# Patient Record
Sex: Male | Born: 1995 | Race: White | Hispanic: No | Marital: Single | Smoking: Never smoker
Health system: Southern US, Community
[De-identification: ages and names within clinical notes are randomized; demographics above are authoritative.]

---
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
## 2015-06-06 ENCOUNTER — Emergency Department (HOSPITAL_COMMUNITY): Payer: BLUE CROSS/BLUE SHIELD

## 2015-06-06 ENCOUNTER — Emergency Department (HOSPITAL_COMMUNITY)
Admission: EM | Admit: 2015-06-06 | Discharge: 2015-06-06 | Disposition: A | Discharge status: Home / Self Care | Payer: BLUE CROSS/BLUE SHIELD | Attending: Emergency Medicine | Admitting: Emergency Medicine

## 2015-06-06 ENCOUNTER — Encounter (HOSPITAL_COMMUNITY): Payer: Self-pay | Admitting: Emergency Medicine

## 2015-06-06 DIAGNOSIS — Y9351 Activity, roller skating (inline) and skateboarding: Secondary | ICD-10-CM | POA: Diagnosis not present

## 2015-06-06 DIAGNOSIS — Y999 Unspecified external cause status: Secondary | ICD-10-CM | POA: Diagnosis not present

## 2015-06-06 DIAGNOSIS — S93401A Sprain of unspecified ligament of right ankle, initial encounter: Secondary | ICD-10-CM | POA: Diagnosis not present

## 2015-06-06 DIAGNOSIS — S99911A Unspecified injury of right ankle, initial encounter: Secondary | ICD-10-CM | POA: Diagnosis present

## 2015-06-06 DIAGNOSIS — Y9289 Other specified places as the place of occurrence of the external cause: Secondary | ICD-10-CM | POA: Diagnosis not present

## 2015-06-06 MED ORDER — NAPROXEN 500 MG PO TABS: 500.0000 mg | ORAL_TABLET | Freq: Two times a day (BID) | ORAL | Status: AC

## 2015-06-06 NOTE — ED Notes (Signed)
Previously injured right ankle playing basketball. Yesterday was at the beach yesterday, was skating, fell and twisted right ankle. Ankle moderately swollen. Pt states he's been using ice and ibuprofen since fall.

## 2015-06-06 NOTE — Discharge Instructions (Signed)
Naprosyn for pain and inflammation. Ice, elevate. Crutches for 1-2 days as needed. Follow up with primary care doctor or orthopedics as needed.    Ankle Sprain An ankle sprain is an injury to the strong, fibrous tissues (ligaments) that hold the bones of your ankle joint together.  CAUSES An ankle sprain is usually caused by a fall or by twisting your ankle. Ankle sprains most commonly occur when you step on the outer edge of your foot, and your ankle turns inward. People who participate in sports are more prone to these types of injuries.  SYMPTOMS   Pain in your ankle. The pain may be present at rest or only when you are trying to stand or walk.  Swelling.  Bruising. Bruising may develop immediately or within 1 to 2 days after your injury.  Difficulty standing or walking, particularly when turning corners or changing directions. DIAGNOSIS  Your caregiver will ask you details about your injury and perform a physical exam of your ankle to determine if you have an ankle sprain. During the physical exam, your caregiver will press on and apply pressure to specific areas of your foot and ankle. Your caregiver will try to move your ankle in certain ways. An X-ray exam may be done to be sure a bone was not broken or a ligament did not separate from one of the bones in your ankle (avulsion fracture).  TREATMENT  Certain types of braces can help stabilize your ankle. Your caregiver can make a recommendation for this. Your caregiver may recommend the use of medicine for pain. If your sprain is severe, your caregiver may refer you to a surgeon who helps to restore function to parts of your skeletal system (orthopedist) or a physical therapist. HOME CARE INSTRUCTIONS   Apply ice to your injury for 1-2 days or as directed by your caregiver. Applying ice helps to reduce inflammation and pain.  Put ice in a plastic bag.  Place a towel between your skin and the bag.  Leave the ice on for 15-20 minutes  at a time, every 2 hours while you are awake.  Only take over-the-counter or prescription medicines for pain, discomfort, or fever as directed by your caregiver.  Elevate your injured ankle above the level of your heart as much as possible for 2-3 days.  If your caregiver recommends crutches, use them as instructed. Gradually put weight on the affected ankle. Continue to use crutches or a cane until you can walk without feeling pain in your ankle.  If you have a plaster splint, wear the splint as directed by your caregiver. Do not rest it on anything harder than a pillow for the first 24 hours. Do not put weight on it. Do not get it wet. You may take it off to take a shower or bath.  You may have been given an elastic bandage to wear around your ankle to provide support. If the elastic bandage is too tight (you have numbness or tingling in your foot or your foot becomes cold and blue), adjust the bandage to make it comfortable.  If you have an air splint, you may blow more air into it or let air out to make it more comfortable. You may take your splint off at night and before taking a shower or bath. Wiggle your toes in the splint several times per day to decrease swelling. SEEK MEDICAL CARE IF:   You have rapidly increasing bruising or swelling.  Your toes feel extremely cold or  you lose feeling in your foot.  Your pain is not relieved with medicine. SEEK IMMEDIATE MEDICAL CARE IF:  Your toes are numb or blue.  You have severe pain that is increasing. MAKE SURE YOU:   Understand these instructions.  Will watch your condition.  Will get help right away if you are not doing well or get worse. Document Released: 10/24/2005 Document Revised: 07/18/2012 Document Reviewed: 11/05/2011 [facility] Patient Information 2015 [place], [place]. This information is not intended to replace advice given to you by your health care provider. Make sure you discuss any questions you have with your health  care provider.

## 2015-06-06 NOTE — ED Provider Notes (Signed)
CSN: [id number]     Arrival date & time 06/06/15  1453 History  This chart was scribed for non-physician practitioner Jaynie Crumble, PA-C, working with Gwyneth Sprout, MD, by Tanda Rockers, ED Scribe. This patient was seen in room WTR5/WTR5 and the patient's care was started at 3:30 PM.   Chief Complaint  Patient presents with  . Ankle Injury   The history is provided by the patient. No language interpreter was used.     HPI Comments: Thomas Singh is a 19 y.o. male who presents to the Emergency Department complaining of sudden onset, mild, right ankle pain that occurred 1-2 days ago while skateboarding. He states that he fell and twisted his ankle, causing the pain. He notes increased swelling to the ankle as well. Pt previously injured his ankle approximately 1 month ago while playing basketball. Pt is able to ambulate but reports it does hurt to bear weight onto the foot. Denies numbness, weakness, tingling, or any other associated symptoms.   History reviewed. No pertinent past medical history. History reviewed. No pertinent past surgical history. History reviewed. No pertinent family history. History  Substance Use Topics  . Smoking status: Never Smoker   . Smokeless tobacco: Not on file  . Alcohol Use: Yes     Comment: occ    Review of Systems  Musculoskeletal: Positive for joint swelling and arthralgias. Negative for gait problem.  Neurological: Negative for weakness and numbness.   Allergies  Lactose intolerance (gi)  Home Medications   Prior to Admission medications   Medication Sig Start Date End Date Taking? Authorizing Provider  ibuprofen (ADVIL,MOTRIN) 200 MG tablet Take 400 mg by mouth every 6 (six) hours as needed for moderate pain.   Yes Historical Provider, MD  chlorpheniramine-HYDROcodone (TUSSIONEX PENNKINETIC ER) 10-8 MG/5ML LQCR Take 5 mLs by mouth every 12 (twelve) hours as needed for cough. Patient not taking: Reported on 06/06/2015 08/28/14   Trixie Dredge, PA-C   Triage Vitals: BP 131/66 mmHg  Pulse 51  Temp(Src) 97.8 F (36.6 C) (Oral)  Resp 18  SpO2 100%   Physical Exam  Constitutional: He is oriented to person, place, and time. He appears well-developed and well-nourished. No distress.  HENT:  Head: Normocephalic and atraumatic.  Eyes: Conjunctivae and EOM are normal.  Neck: Neck supple. No tracheal deviation present.  Cardiovascular: Normal rate.   Pulmonary/Chest: Effort normal. No respiratory distress.  Musculoskeletal: Normal range of motion.  Mild right ankle swelling over lateral malleolus. ttp over lateral malleolus. Achilles tendon intact. Full rom of the ankle. Normal foot. dp pulses intact. Joint stable.   Neurological: He is alert and oriented to person, place, and time.  Skin: Skin is warm and dry.  Psychiatric: He has a normal mood and affect. His behavior is normal.  Nursing note and vitals reviewed.   ED Course  Procedures (including critical care time)  DIAGNOSTIC STUDIES: Oxygen Saturation is 100% on RA, normal by my interpretation.    COORDINATION OF CARE: 3:32 PM-Discussed treatment plan which includes ankle brace with pt at bedside and pt agreed to plan.   Labs Review Labs Reviewed - No data to display  Imaging Review Dg Ankle Complete Right  06/06/2015   CLINICAL DATA:  Fall 2 days ago. Right ankle swelling and pain. Initial encounter.  EXAM: RIGHT ANKLE - COMPLETE 3+ VIEW  COMPARISON:  None.  FINDINGS: There is no evidence of fracture, dislocation, or joint effusion. There is no evidence of arthropathy or other focal bone abnormality.  Mild soft tissue swelling seen overlying the lateral malleolus.  IMPRESSION: Lateral soft tissue swelling. No evidence of fracture or dislocation.   Electronically Signed   By: Myles Rosenthal M.D.   On: 06/06/2015 15:26     EKG Interpretation None      MDM   Final diagnoses:  Ankle sprain, right, initial encounter   Ankle pain, unsure of exact mechanism or  when, poor historian. Ankle is mildly swollen xray negative. Most consistent with ankle sprain.  Home with Ice, elevation, ASO, crutches. Naprosyn for pain and inflammation. Follow up as needed.    Filed Vitals:   06/06/15 1501  BP: 131/66  Pulse: 51  Temp: 97.8 F (36.6 C)  TempSrc: Oral  Resp: 18  SpO2: 100%   I personally performed the services described in this documentation, which was scribed in my presence. The recorded information has been reviewed and is accurate.   Jaynie Crumble, PA-C 06/06/15 1610  Gwyneth Sprout, MD 06/06/15 2249

## 2017-02-28 IMAGING — CR DG ANKLE COMPLETE 3+V*R*
3 series · 3 of 3 positions shown · non-contrast
Comparison: None.

CLINICAL DATA: Fall 2 days ago. Right ankle swelling and pain.
Initial encounter.

EXAM:
RIGHT ANKLE - COMPLETE 3+ VIEW

[x ankle ap right]
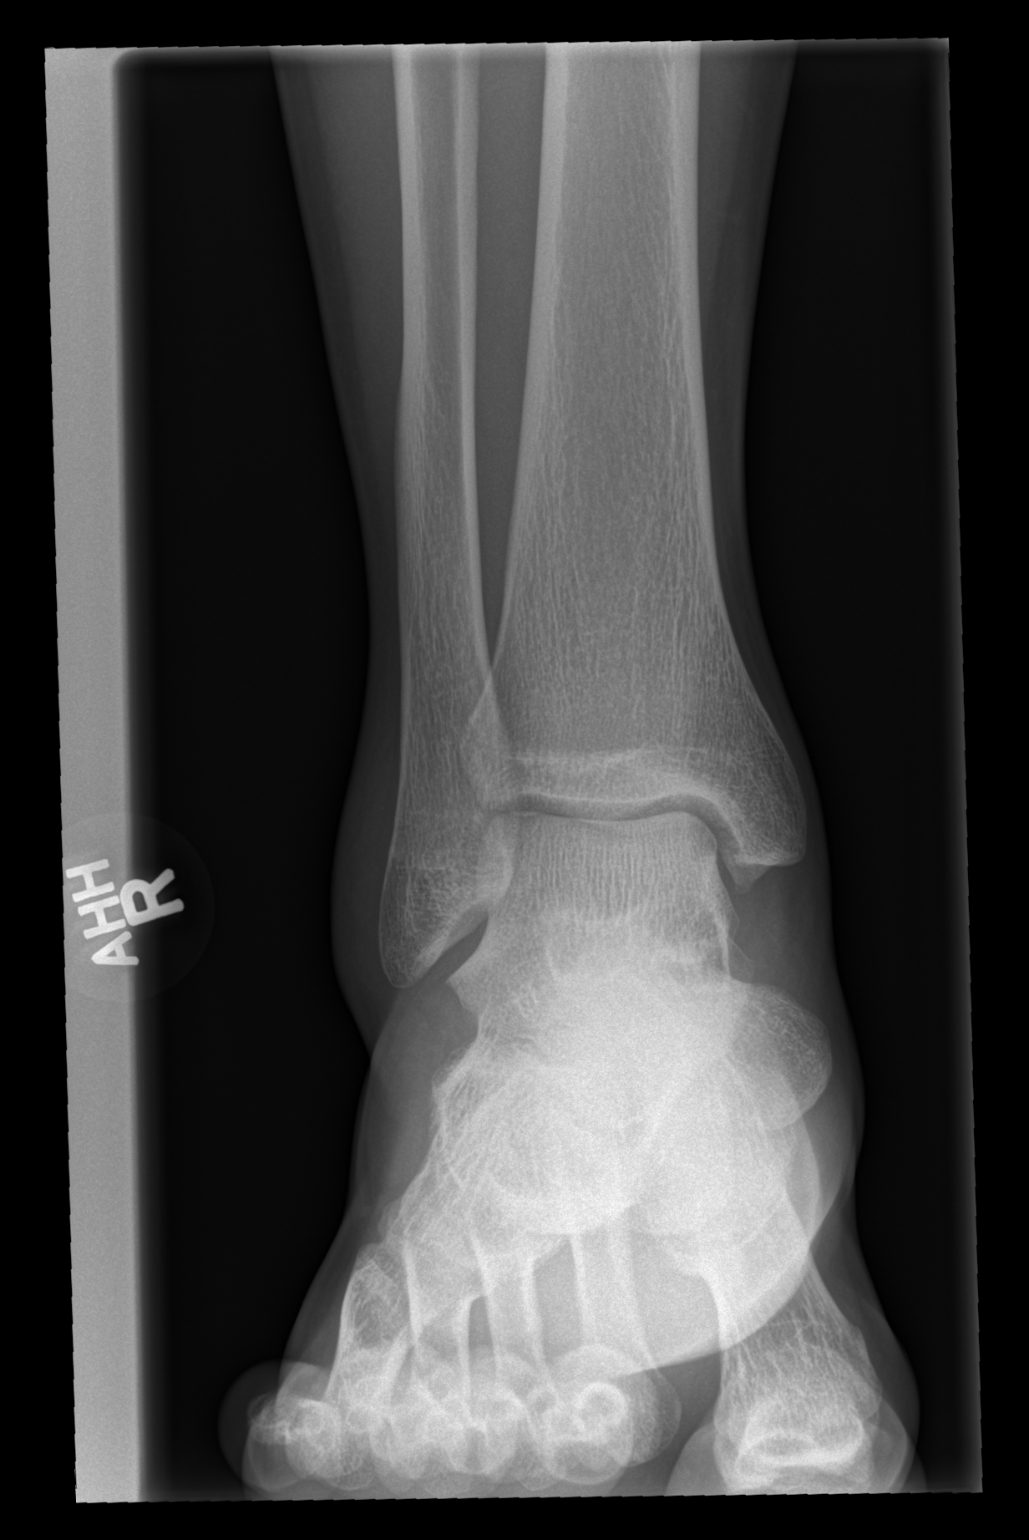

[x ankle obl right]
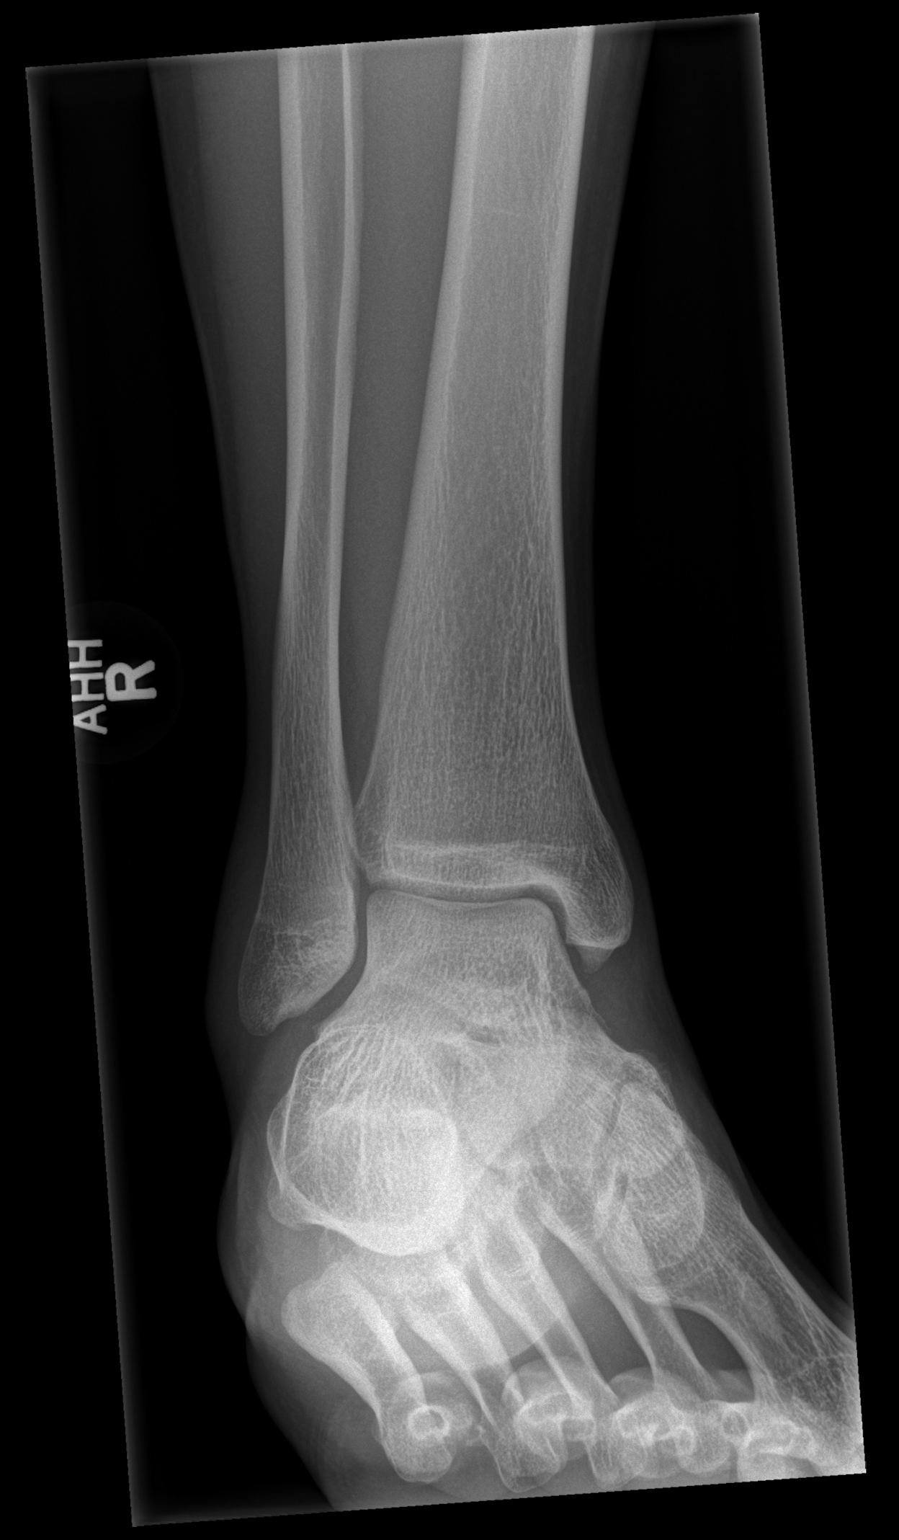

[x ankle lat right]
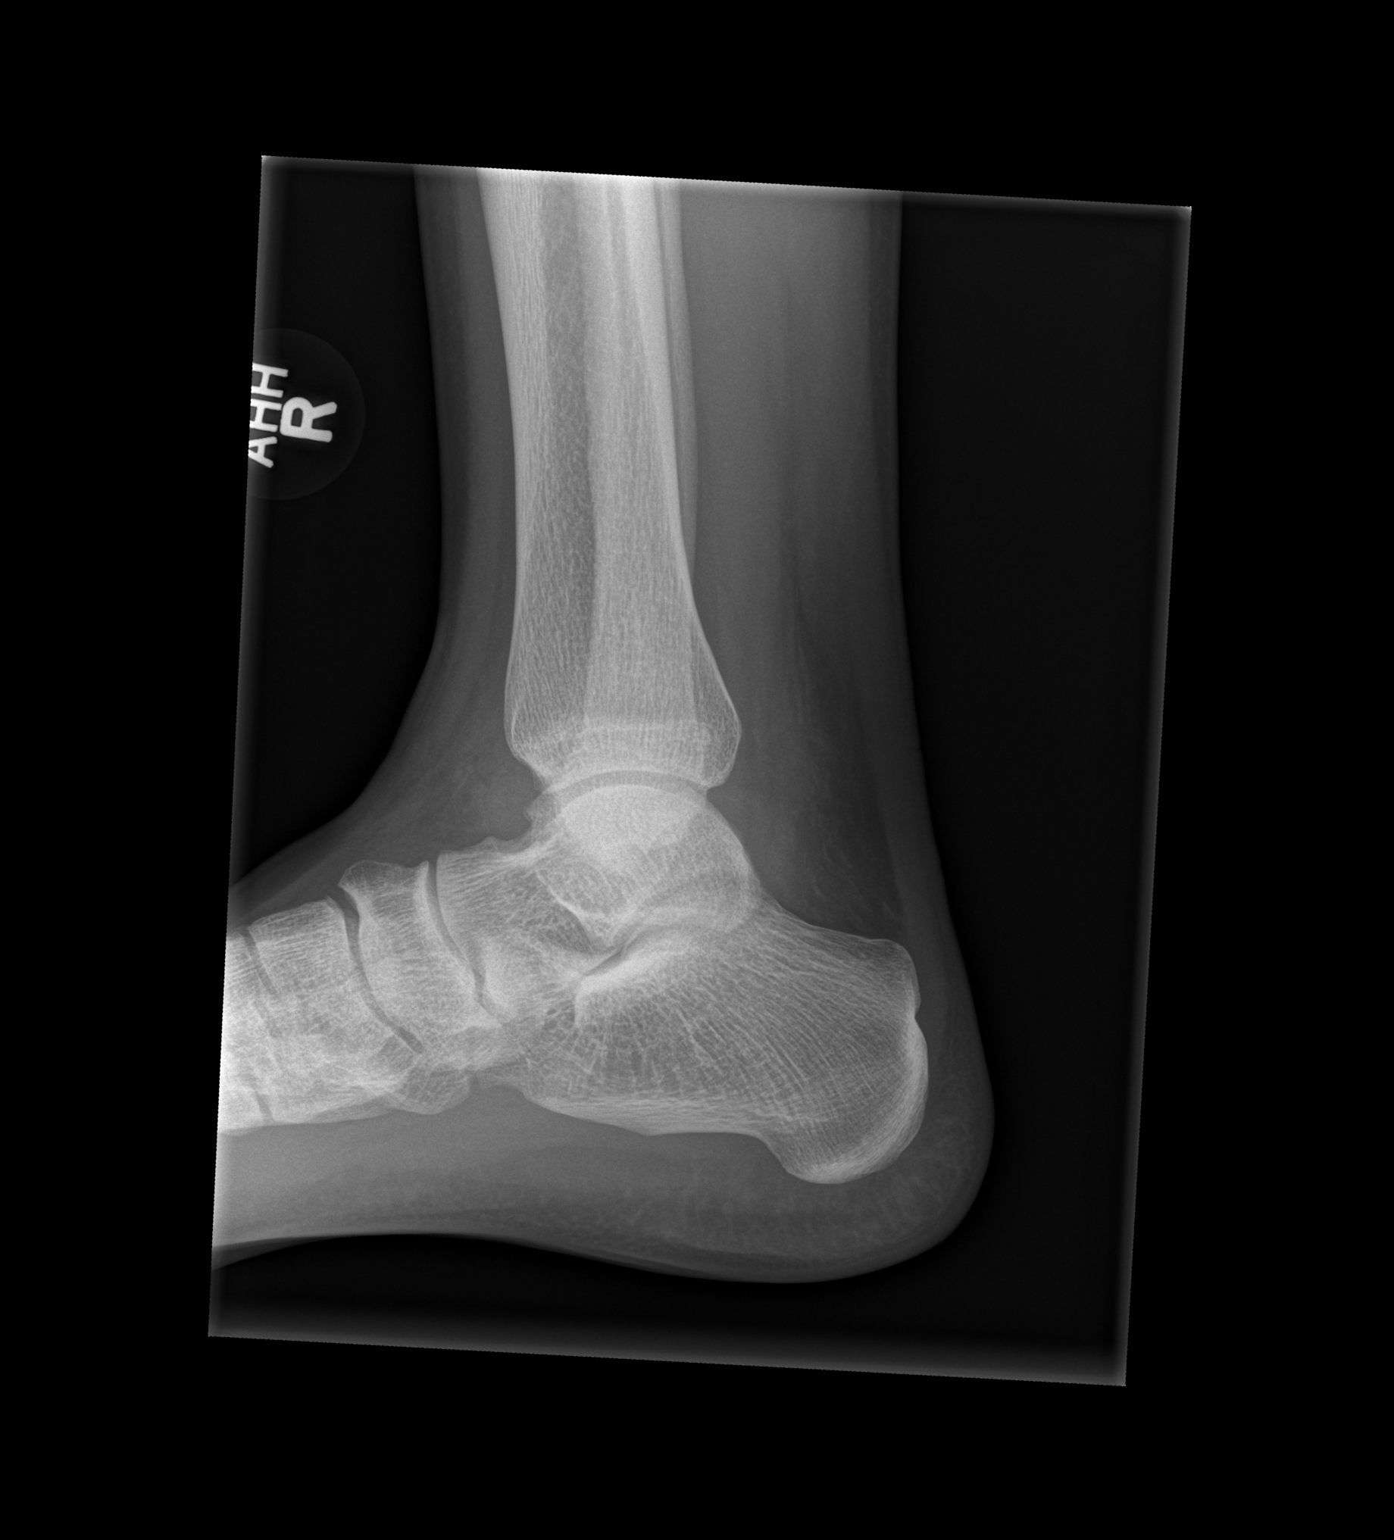

[3 of 3 positions shown; findings below may reference images not displayed]

FINDINGS: There is no evidence of fracture, dislocation, or joint effusion.
There is no evidence of arthropathy or other focal bone abnormality.
Mild soft tissue swelling seen overlying the lateral malleolus.
IMPRESSION: Lateral soft tissue swelling. No evidence of fracture or
dislocation.

## 2024-09-10 ENCOUNTER — Ambulatory Visit: Admitting: Dermatology
